# Patient Record
Sex: Female | Born: 1970 | Race: White | Marital: Married
Health system: Southern US, Community
[De-identification: ages and names within clinical notes are randomized; demographics above are authoritative.]

---

## 2014-04-13 ENCOUNTER — Ambulatory Visit: Payer: Self-pay | Admitting: Otolaryngology

## 2016-04-26 ENCOUNTER — Ambulatory Visit: Payer: Self-pay

## 2016-05-24 ENCOUNTER — Ambulatory Visit: Payer: Self-pay | Attending: Oncology

## 2020-03-15 ENCOUNTER — Other Ambulatory Visit (HOSPITAL_COMMUNITY): Payer: Self-pay | Admitting: Neurology

## 2020-03-15 ENCOUNTER — Other Ambulatory Visit: Payer: Self-pay | Admitting: Neurology

## 2020-03-15 DIAGNOSIS — R4189 Other symptoms and signs involving cognitive functions and awareness: Secondary | ICD-10-CM

## 2020-03-30 ENCOUNTER — Ambulatory Visit (HOSPITAL_COMMUNITY): Payer: 59

## 2020-03-30 ENCOUNTER — Encounter (HOSPITAL_COMMUNITY): Payer: Self-pay

## 2020-10-06 ENCOUNTER — Other Ambulatory Visit: Payer: Self-pay | Admitting: Obstetrics and Gynecology

## 2020-10-06 DIAGNOSIS — Z1231 Encounter for screening mammogram for malignant neoplasm of breast: Secondary | ICD-10-CM

## 2020-11-23 ENCOUNTER — Other Ambulatory Visit: Payer: Self-pay

## 2020-11-23 ENCOUNTER — Ambulatory Visit
Admission: RE | Admit: 2020-11-23 | Discharge: 2020-11-23 | Disposition: A | Discharge status: Home / Self Care | Payer: 59 | Source: Ambulatory Visit | Attending: Obstetrics and Gynecology | Admitting: Obstetrics and Gynecology

## 2020-11-23 DIAGNOSIS — Z1231 Encounter for screening mammogram for malignant neoplasm of breast: Secondary | ICD-10-CM | POA: Diagnosis present

## 2020-11-24 ENCOUNTER — Other Ambulatory Visit: Payer: Self-pay | Admitting: *Deleted

## 2020-11-24 ENCOUNTER — Inpatient Hospital Stay
Admission: RE | Admit: 2020-11-24 | Discharge: 2020-11-24 | Disposition: A | Discharge status: Home / Self Care | Payer: Self-pay | Source: Ambulatory Visit | Attending: *Deleted | Admitting: *Deleted

## 2020-11-24 DIAGNOSIS — Z1231 Encounter for screening mammogram for malignant neoplasm of breast: Secondary | ICD-10-CM

## 2020-11-25 ENCOUNTER — Other Ambulatory Visit: Payer: Self-pay | Admitting: Obstetrics and Gynecology

## 2020-11-30 ENCOUNTER — Other Ambulatory Visit: Payer: Self-pay | Admitting: Obstetrics and Gynecology

## 2020-11-30 DIAGNOSIS — R921 Mammographic calcification found on diagnostic imaging of breast: Secondary | ICD-10-CM

## 2020-11-30 DIAGNOSIS — R928 Other abnormal and inconclusive findings on diagnostic imaging of breast: Secondary | ICD-10-CM

## 2020-12-08 ENCOUNTER — Other Ambulatory Visit: Payer: Self-pay

## 2020-12-08 ENCOUNTER — Ambulatory Visit
Admission: RE | Admit: 2020-12-08 | Discharge: 2020-12-08 | Disposition: A | Discharge status: Home / Self Care | Payer: 59 | Source: Ambulatory Visit | Attending: Obstetrics and Gynecology | Admitting: Obstetrics and Gynecology

## 2020-12-08 DIAGNOSIS — R928 Other abnormal and inconclusive findings on diagnostic imaging of breast: Secondary | ICD-10-CM | POA: Diagnosis not present

## 2020-12-08 DIAGNOSIS — R921 Mammographic calcification found on diagnostic imaging of breast: Secondary | ICD-10-CM

## 2020-12-16 ENCOUNTER — Other Ambulatory Visit: Payer: Self-pay | Admitting: Obstetrics and Gynecology

## 2020-12-16 DIAGNOSIS — R928 Other abnormal and inconclusive findings on diagnostic imaging of breast: Secondary | ICD-10-CM

## 2020-12-16 DIAGNOSIS — R921 Mammographic calcification found on diagnostic imaging of breast: Secondary | ICD-10-CM

## 2020-12-21 ENCOUNTER — Ambulatory Visit
Admission: RE | Admit: 2020-12-21 | Discharge: 2020-12-21 | Disposition: A | Discharge status: Home / Self Care | Payer: 59 | Source: Ambulatory Visit | Attending: Obstetrics and Gynecology | Admitting: Obstetrics and Gynecology

## 2020-12-21 ENCOUNTER — Other Ambulatory Visit: Payer: Self-pay

## 2020-12-21 DIAGNOSIS — R921 Mammographic calcification found on diagnostic imaging of breast: Secondary | ICD-10-CM | POA: Insufficient documentation

## 2020-12-21 DIAGNOSIS — R928 Other abnormal and inconclusive findings on diagnostic imaging of breast: Secondary | ICD-10-CM | POA: Insufficient documentation

## 2020-12-21 HISTORY — PX: BREAST BIOPSY: SHX20

## 2020-12-22 LAB — SURGICAL PATHOLOGY

## 2022-03-28 DIAGNOSIS — R5383 Other fatigue: Secondary | ICD-10-CM | POA: Diagnosis not present

## 2022-03-28 DIAGNOSIS — R1011 Right upper quadrant pain: Secondary | ICD-10-CM | POA: Diagnosis not present

## 2022-03-28 DIAGNOSIS — E559 Vitamin D deficiency, unspecified: Secondary | ICD-10-CM | POA: Diagnosis not present

## 2022-03-28 DIAGNOSIS — E785 Hyperlipidemia, unspecified: Secondary | ICD-10-CM | POA: Diagnosis not present

## 2022-04-04 DIAGNOSIS — R1011 Right upper quadrant pain: Secondary | ICD-10-CM | POA: Diagnosis not present

## 2022-04-04 DIAGNOSIS — E785 Hyperlipidemia, unspecified: Secondary | ICD-10-CM | POA: Diagnosis not present

## 2022-04-04 DIAGNOSIS — M549 Dorsalgia, unspecified: Secondary | ICD-10-CM | POA: Diagnosis not present

## 2022-04-04 DIAGNOSIS — R5383 Other fatigue: Secondary | ICD-10-CM | POA: Diagnosis not present

## 2022-04-25 DIAGNOSIS — E785 Hyperlipidemia, unspecified: Secondary | ICD-10-CM | POA: Diagnosis not present

## 2022-04-25 DIAGNOSIS — E559 Vitamin D deficiency, unspecified: Secondary | ICD-10-CM | POA: Diagnosis not present

## 2022-04-25 DIAGNOSIS — R5383 Other fatigue: Secondary | ICD-10-CM | POA: Diagnosis not present

## 2022-04-25 DIAGNOSIS — R1011 Right upper quadrant pain: Secondary | ICD-10-CM | POA: Diagnosis not present

## 2022-04-27 DIAGNOSIS — Z01419 Encounter for gynecological examination (general) (routine) without abnormal findings: Secondary | ICD-10-CM | POA: Diagnosis not present

## 2022-04-27 DIAGNOSIS — B3731 Acute candidiasis of vulva and vagina: Secondary | ICD-10-CM | POA: Diagnosis not present

## 2022-05-01 ENCOUNTER — Other Ambulatory Visit: Payer: Self-pay | Admitting: Obstetrics and Gynecology

## 2022-05-01 DIAGNOSIS — Z1231 Encounter for screening mammogram for malignant neoplasm of breast: Secondary | ICD-10-CM

## 2022-05-08 ENCOUNTER — Other Ambulatory Visit (HOSPITAL_COMMUNITY): Payer: Self-pay | Admitting: Internal Medicine

## 2022-05-08 DIAGNOSIS — R1011 Right upper quadrant pain: Secondary | ICD-10-CM

## 2022-05-08 DIAGNOSIS — R109 Unspecified abdominal pain: Secondary | ICD-10-CM

## 2022-05-16 ENCOUNTER — Ambulatory Visit: Payer: Self-pay

## 2022-05-16 ENCOUNTER — Other Ambulatory Visit: Payer: Self-pay

## 2022-05-17 ENCOUNTER — Ambulatory Visit: Payer: Self-pay

## 2022-05-23 ENCOUNTER — Ambulatory Visit
Admission: RE | Admit: 2022-05-23 | Discharge: 2022-05-23 | Disposition: A | Discharge status: Home / Self Care | Payer: 59 | Source: Ambulatory Visit | Attending: Internal Medicine | Admitting: Internal Medicine

## 2022-05-23 ENCOUNTER — Other Ambulatory Visit (HOSPITAL_COMMUNITY): Payer: Self-pay | Admitting: Internal Medicine

## 2022-05-23 DIAGNOSIS — R109 Unspecified abdominal pain: Secondary | ICD-10-CM | POA: Insufficient documentation

## 2022-05-23 DIAGNOSIS — R1011 Right upper quadrant pain: Secondary | ICD-10-CM

## 2022-05-23 DIAGNOSIS — R102 Pelvic and perineal pain: Secondary | ICD-10-CM | POA: Diagnosis not present

## 2022-06-20 IMAGING — MG MM DIGITAL SCREENING BILAT W/ TOMO AND CAD
8 series · 8 of 24 positions shown · non-contrast
Comparison: Previous exam(s).

CLINICAL DATA: Screening.

EXAM:
DIGITAL SCREENING BILATERAL MAMMOGRAM WITH TOMOSYNTHESIS AND CAD
TECHNIQUE: Bilateral screening digital craniocaudal and mediolateral oblique
mammograms were obtained. Bilateral screening digital breast
tomosynthesis was performed. The images were evaluated with
computer-aided detection.

[L CC synth-2D]
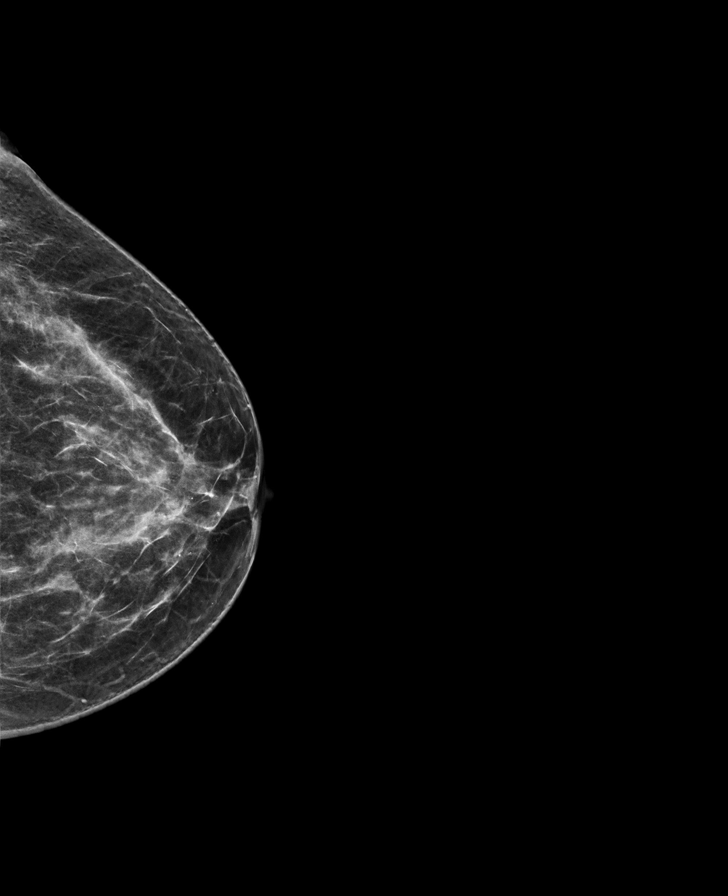

[R CC synth-2D]
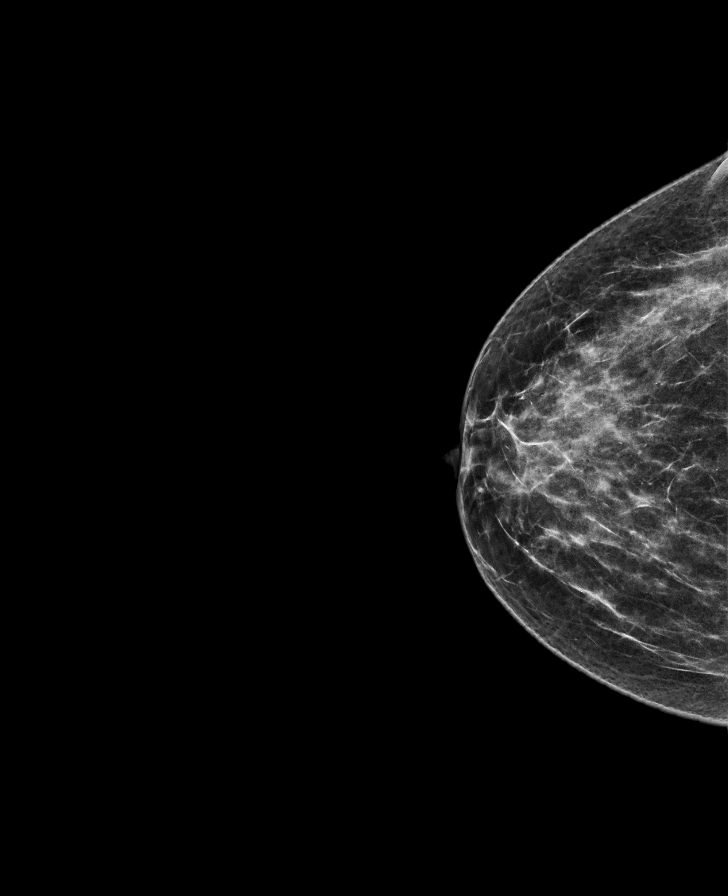

[R MLO synth-2D]
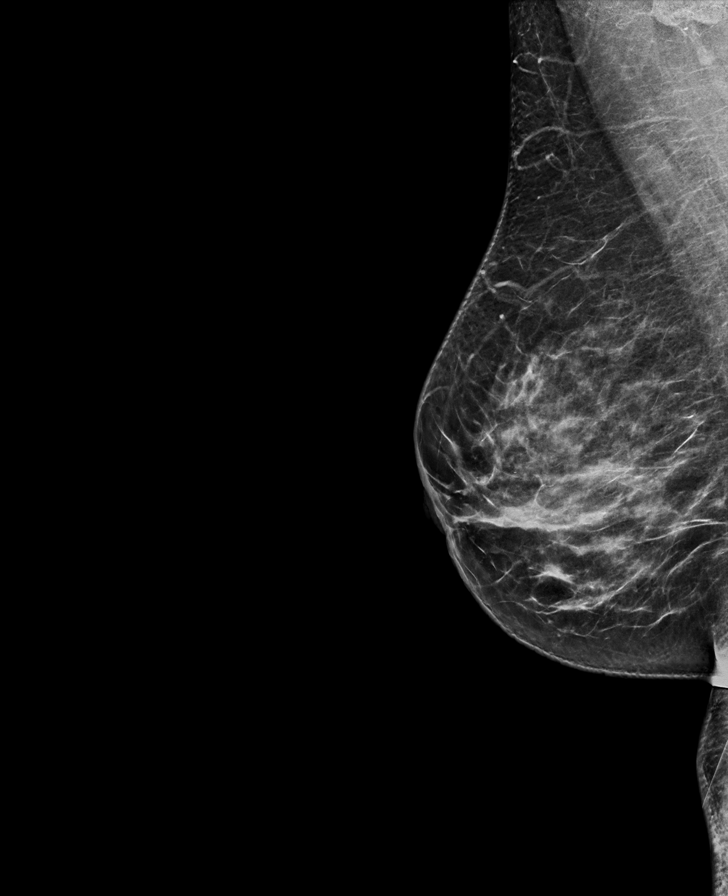

[L MLO synth-2D]
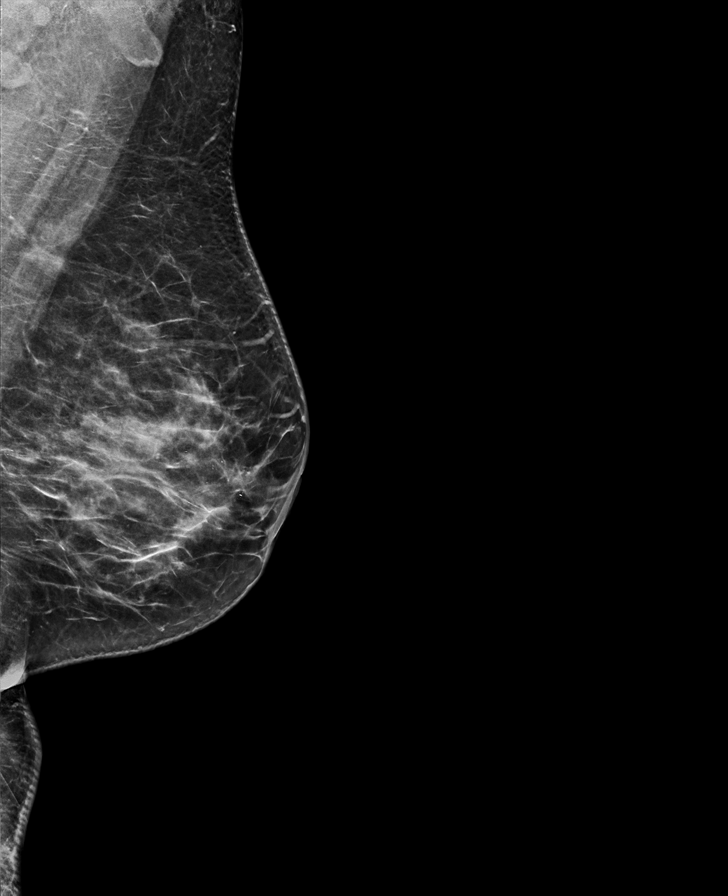

[L MLO tomo · tomo slice 33/65.0]
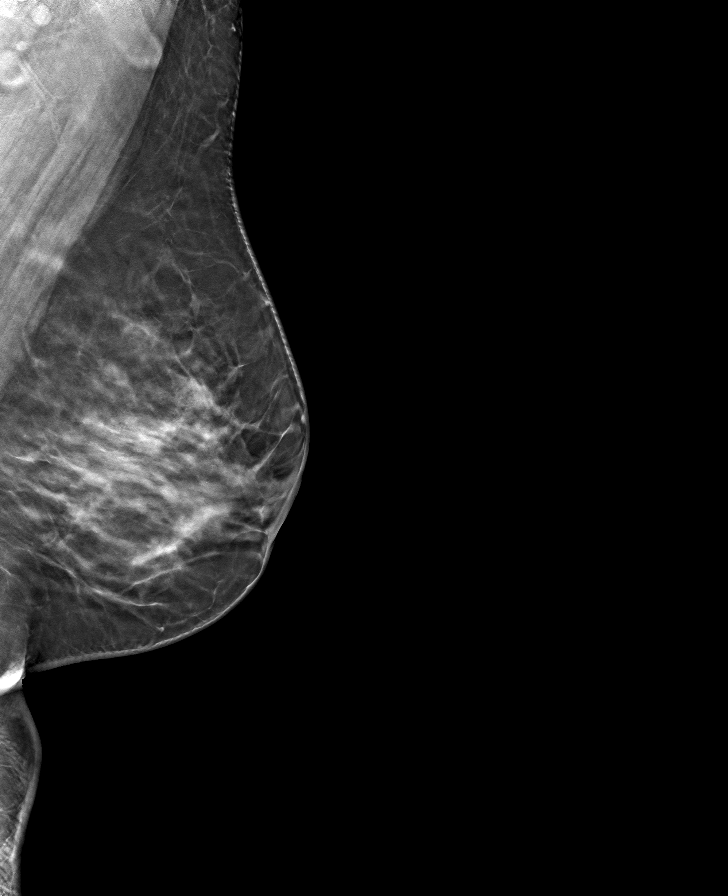

[L CC tomo · tomo slice 33/64.0]
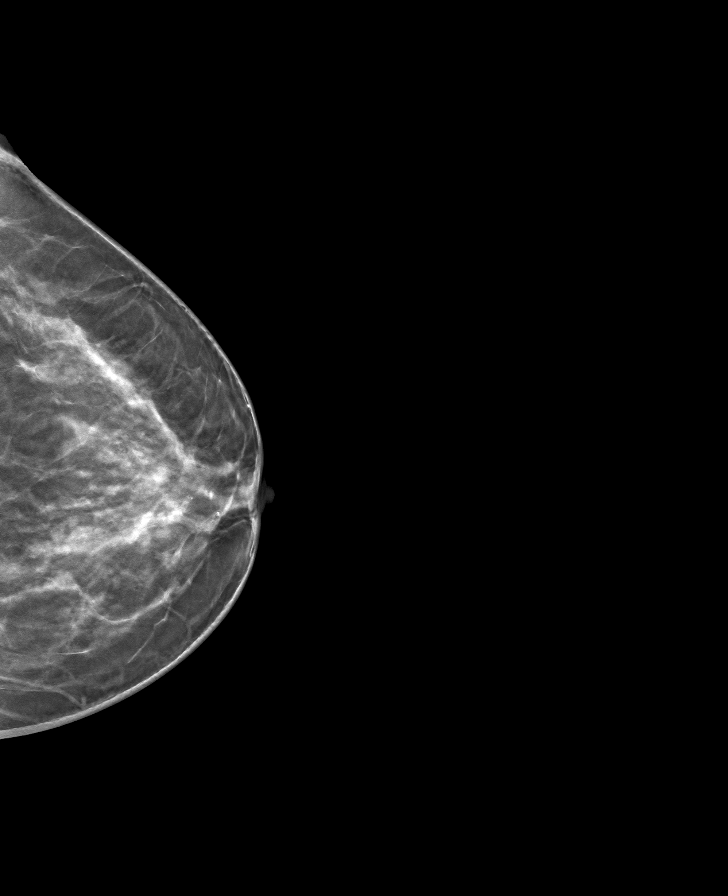

[R MLO tomo · tomo slice 33/64.0]
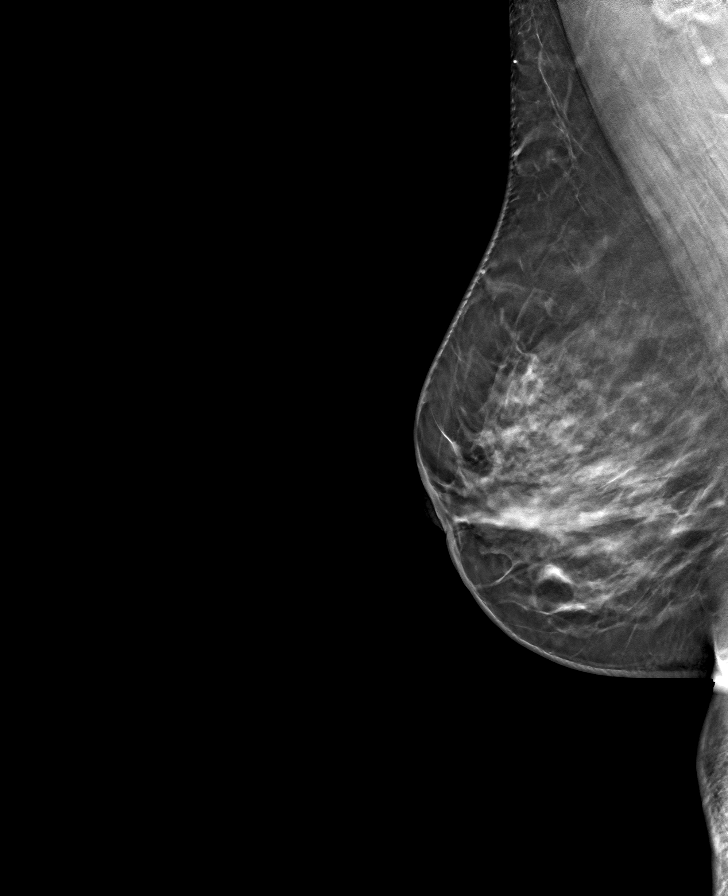

[R CC tomo · tomo slice 31/61.0]
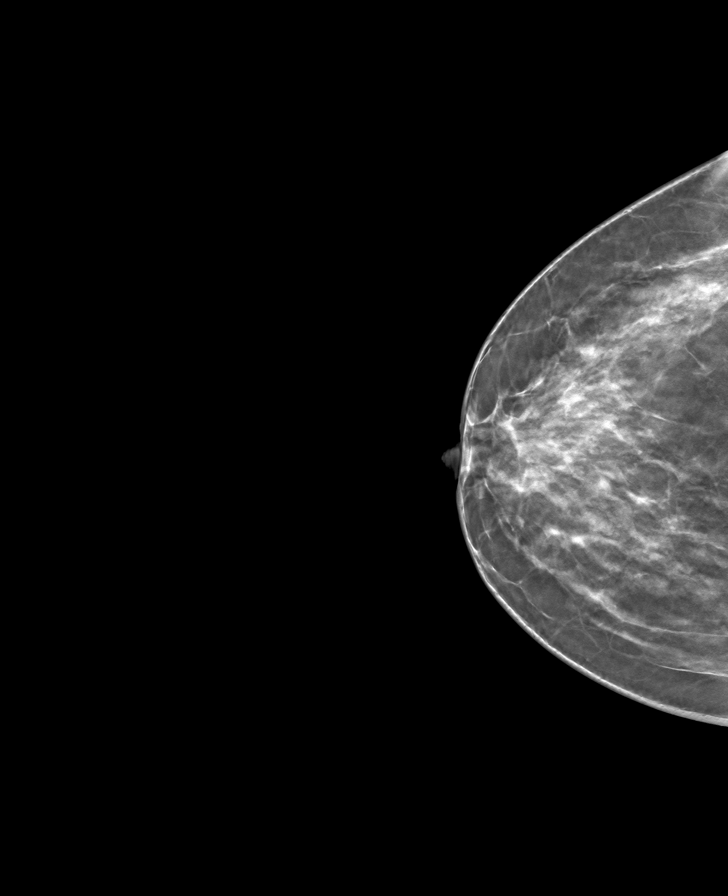

[8 of 24 positions shown; findings below may reference images not displayed]

ACR Breast Density Category c: The breast tissue is heterogeneously
dense, which may obscure small masses.
FINDINGS: In the left breast, calcifications warrant further evaluation. In
the right breast, no findings suspicious for malignancy.
IMPRESSION: Further evaluation is suggested for calcifications in the left
breast.

RECOMMENDATION:
Diagnostic mammogram of the left breast. (Code:AS-B-FFA)

The patient will be contacted regarding the findings, and additional
imaging will be scheduled.

BI-RADS CATEGORY  0: Incomplete. Need additional imaging evaluation
and/or prior mammograms for comparison.

## 2022-06-29 ENCOUNTER — Ambulatory Visit
Admission: RE | Admit: 2022-06-29 | Discharge: 2022-06-29 | Disposition: A | Discharge status: Home / Self Care | Payer: 59 | Source: Ambulatory Visit | Attending: Obstetrics and Gynecology | Admitting: Obstetrics and Gynecology

## 2022-06-29 DIAGNOSIS — Z1231 Encounter for screening mammogram for malignant neoplasm of breast: Secondary | ICD-10-CM | POA: Diagnosis not present

## 2022-07-18 IMAGING — MG MM BREAST LOCALIZATION CLIP
4 series · 4 of 12 positions shown · non-contrast
Comparison: Previous exam(s).

CLINICAL DATA: Patient is post stereotactic core needle biopsy of a
1.4 cm group of indeterminate microcalcifications over the left
retroareolar region.

EXAM:
3D DIAGNOSTIC LEFT MAMMOGRAM POST STEREOTACTIC BIOPSY

[L CC synth-2D]
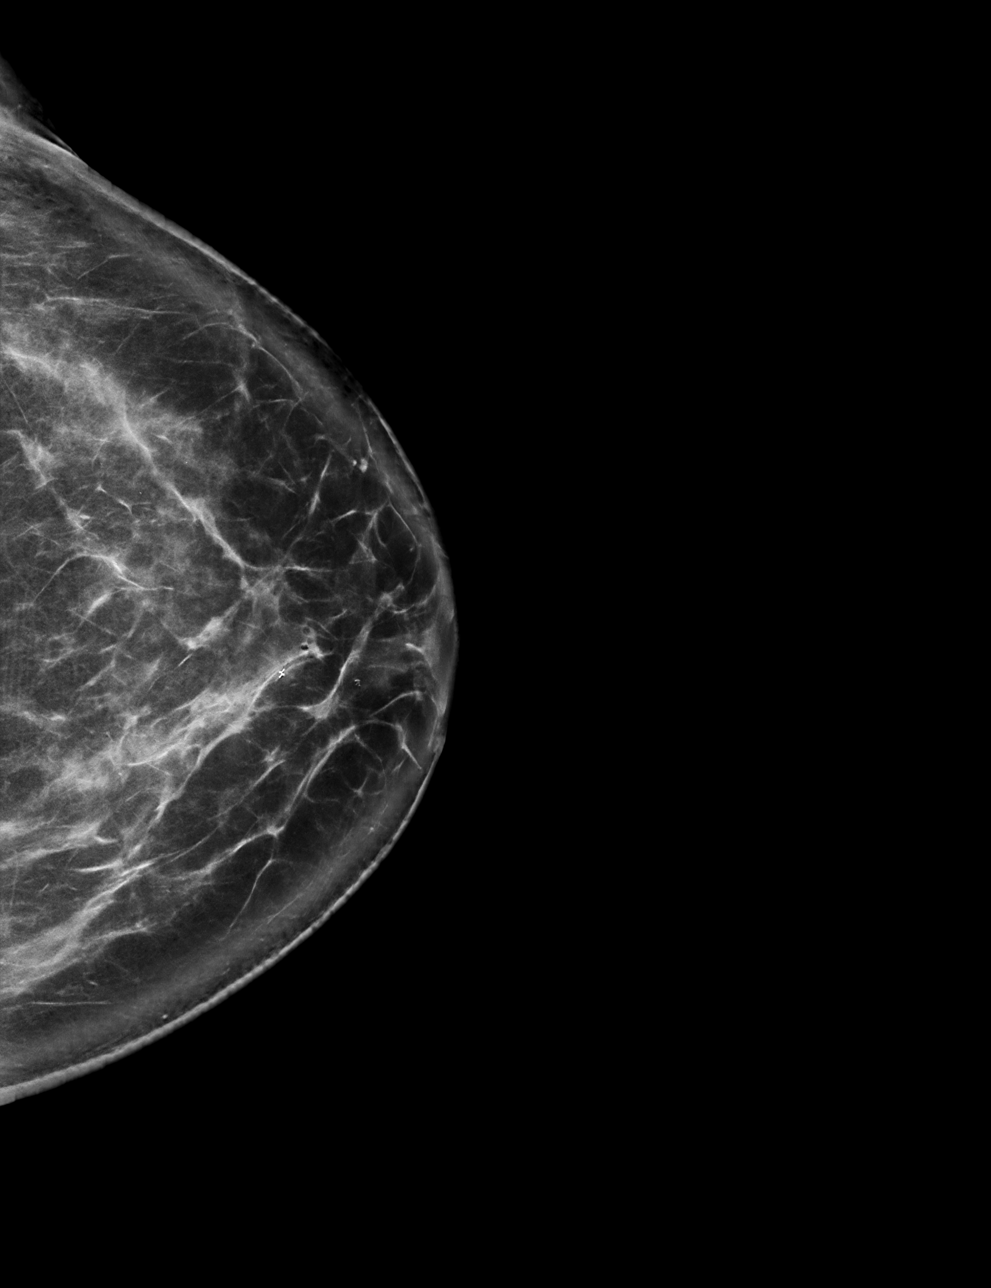

[L ML synth-2D]
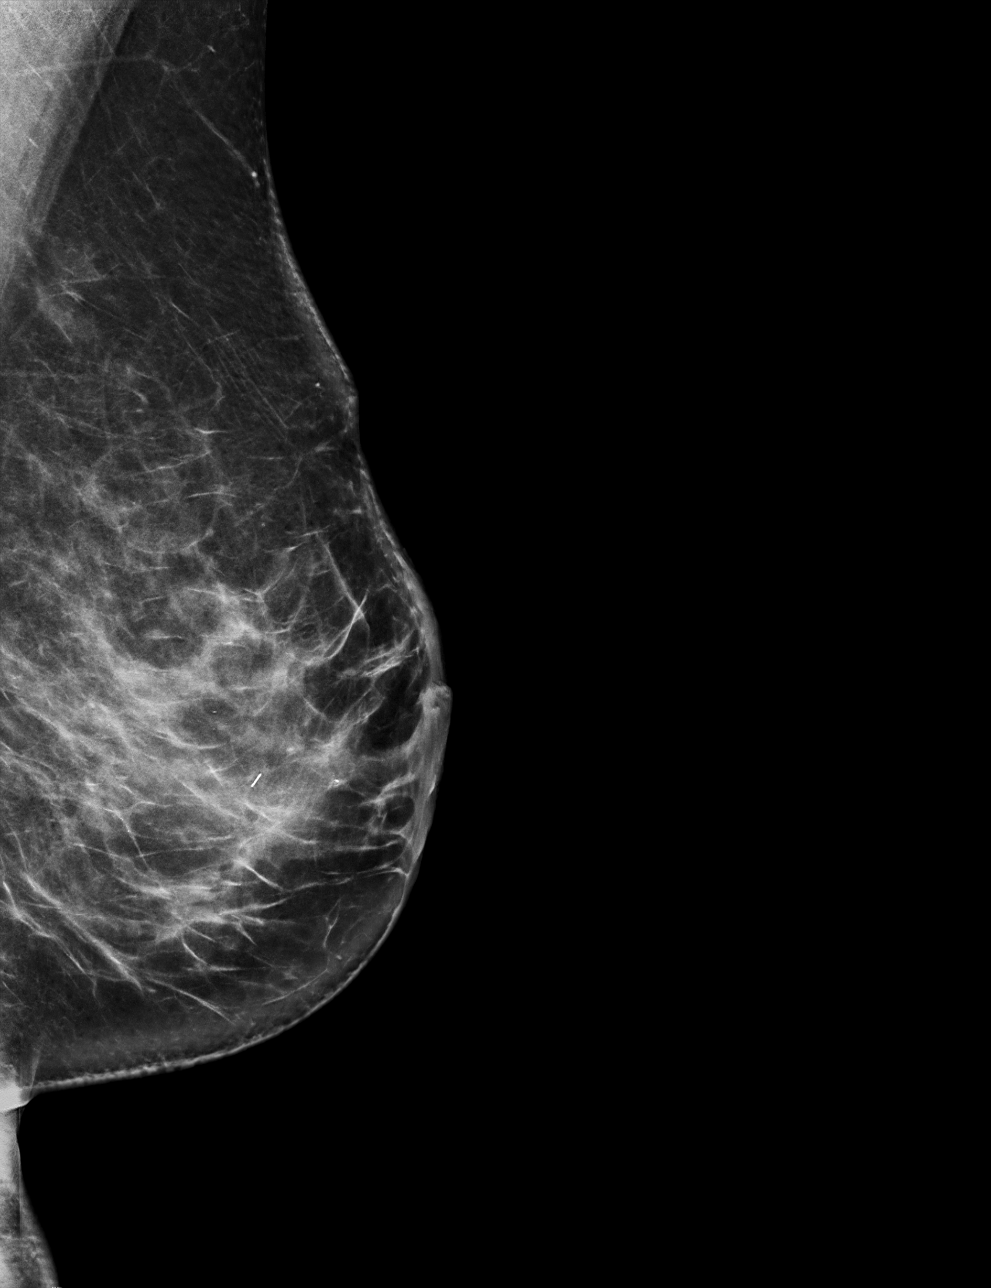

[L CC tomo · tomo slice 43/84.0]
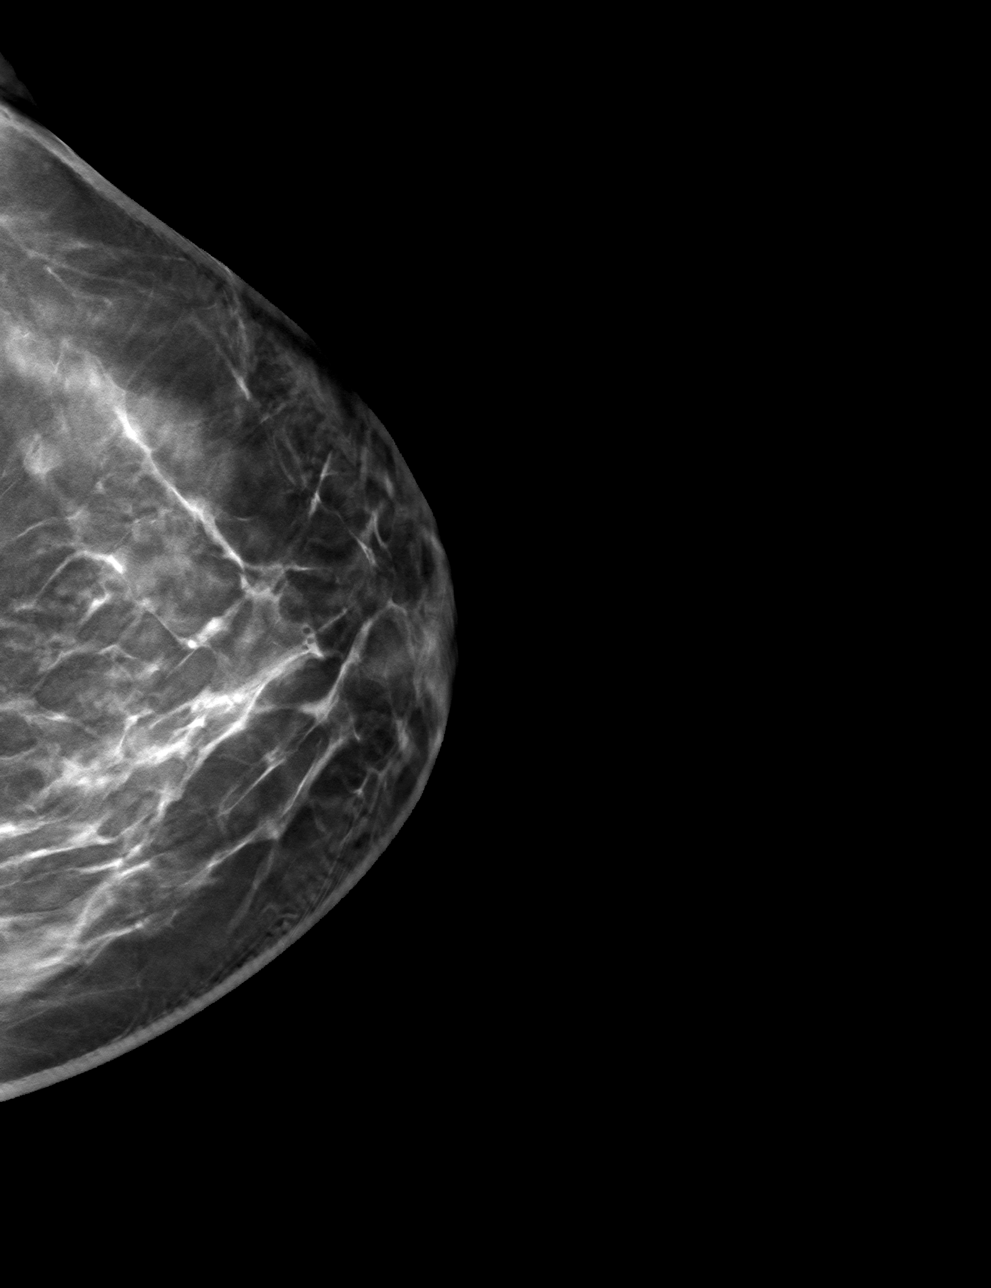

[L ML tomo · tomo slice 41/82.0]
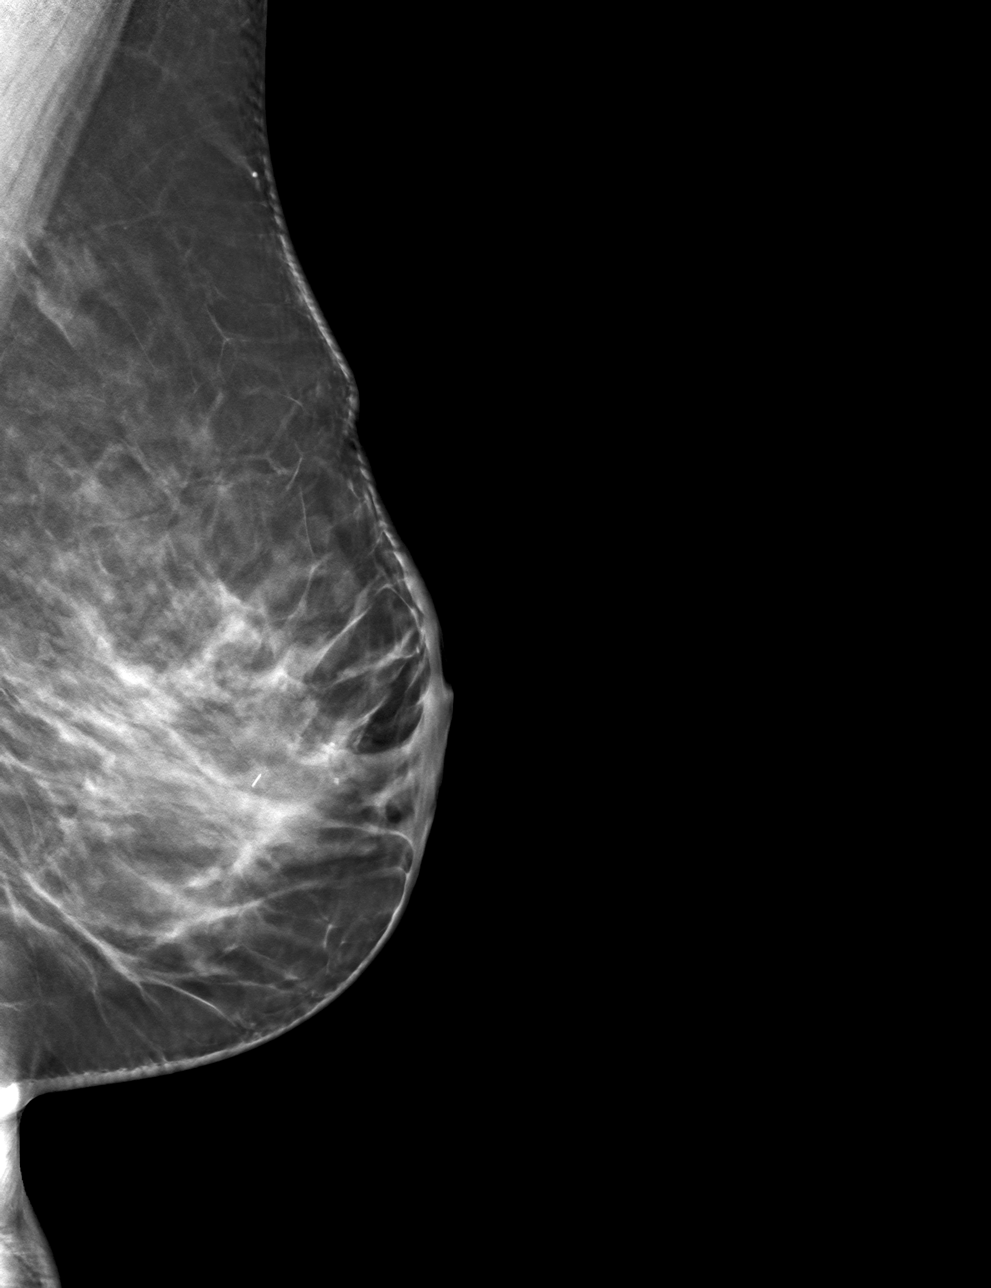

[4 of 12 positions shown; findings below may reference images not displayed]

FINDINGS: 3D Mammographic images were obtained following stereotactic guided
biopsy of the targeted microcalcifications of the left retroareolar
region. The biopsy marking clip is in expected position at the site
of biopsy.
IMPRESSION: Appropriate positioning of the X shaped biopsy marking clip at the
site of biopsy in the left retroareolar region.

Final Assessment: Post Procedure Mammograms for Marker Placement

## 2022-08-14 ENCOUNTER — Encounter: Payer: Self-pay | Admitting: Emergency Medicine

## 2022-08-14 ENCOUNTER — Emergency Department: Payer: 59

## 2022-08-14 ENCOUNTER — Emergency Department
Admission: EM | Admit: 2022-08-14 | Discharge: 2022-08-15 | Disposition: A | Discharge status: Home / Self Care | Payer: 59 | Attending: Emergency Medicine | Admitting: Emergency Medicine

## 2022-08-14 DIAGNOSIS — R112 Nausea with vomiting, unspecified: Secondary | ICD-10-CM | POA: Diagnosis not present

## 2022-08-14 DIAGNOSIS — R202 Paresthesia of skin: Secondary | ICD-10-CM | POA: Insufficient documentation

## 2022-08-14 DIAGNOSIS — R109 Unspecified abdominal pain: Secondary | ICD-10-CM

## 2022-08-14 DIAGNOSIS — R252 Cramp and spasm: Secondary | ICD-10-CM | POA: Diagnosis not present

## 2022-08-14 DIAGNOSIS — Z20822 Contact with and (suspected) exposure to covid-19: Secondary | ICD-10-CM | POA: Insufficient documentation

## 2022-08-14 LAB — URINALYSIS, ROUTINE W REFLEX MICROSCOPIC
Glucose, UA: NEGATIVE mg/dL
Hgb urine dipstick: NEGATIVE
Ketones, ur: 20 mg/dL — AB
Leukocytes,Ua: NEGATIVE
Nitrite: NEGATIVE
Protein, ur: 100 mg/dL — AB
Specific Gravity, Urine: 1.029 (ref 1.005–1.030)
pH: 8 (ref 5.0–8.0)

## 2022-08-14 LAB — CBC
HCT: 41.3 % (ref 36.0–46.0)
Hemoglobin: 14.9 g/dL (ref 12.0–15.0)
MCH: 30.2 pg (ref 26.0–34.0)
MCHC: 36.1 g/dL — ABNORMAL HIGH (ref 30.0–36.0)
MCV: 83.8 fL (ref 80.0–100.0)
Platelets: 203 10*3/uL (ref 150–400)
RBC: 4.93 MIL/uL (ref 3.87–5.11)
RDW: 12.7 % (ref 11.5–15.5)
WBC: 10.3 10*3/uL (ref 4.0–10.5)
nRBC: 0 % (ref 0.0–0.2)

## 2022-08-14 LAB — COMPREHENSIVE METABOLIC PANEL
ALT: 29 U/L (ref 0–44)
AST: 31 U/L (ref 15–41)
Albumin: 4.5 g/dL (ref 3.5–5.0)
Alkaline Phosphatase: 66 U/L (ref 38–126)
Anion gap: 11 (ref 5–15)
BUN: 13 mg/dL (ref 6–20)
CO2: 22 mmol/L (ref 22–32)
Calcium: 9.3 mg/dL (ref 8.9–10.3)
Chloride: 102 mmol/L (ref 98–111)
Creatinine, Ser: 0.78 mg/dL (ref 0.44–1.00)
GFR, Estimated: >60 mL/min (ref >60)
Glucose, Bld: 153 mg/dL — ABNORMAL HIGH (ref 70–99)
Potassium: 3.1 mmol/L — ABNORMAL LOW (ref 3.5–5.1)
Sodium: 135 mmol/L (ref 135–145)
Total Bilirubin: 1.5 mg/dL — ABNORMAL HIGH (ref 0.3–1.2)
Total Protein: 7.3 g/dL (ref 6.5–8.1)

## 2022-08-14 LAB — HCG, QUANTITATIVE, PREGNANCY: hCG, Beta Chain, Quant, S: 2 m[IU]/mL (ref <5)

## 2022-08-14 LAB — MAGNESIUM: Magnesium: 1.7 mg/dL (ref 1.7–2.4)

## 2022-08-14 LAB — TROPONIN I (HIGH SENSITIVITY): Troponin I (High Sensitivity): <2 ng/L (ref <18)

## 2022-08-14 LAB — LIPASE, BLOOD: Lipase: 32 U/L (ref 11–51)

## 2022-08-14 MED ORDER — SODIUM CHLORIDE 0.9 % IV BOLUS
1000.0000 mL | Freq: Once | INTRAVENOUS | Status: AC
  Administered 2022-08-14: 1000 mL via INTRAVENOUS

## 2022-08-14 MED ORDER — FENTANYL CITRATE PF 50 MCG/ML IJ SOSY
50.0000 ug | PREFILLED_SYRINGE | Freq: Once | INTRAMUSCULAR | Status: AC
  Administered 2022-08-14: 50 ug via INTRAVENOUS
  Filled 2022-08-14: qty 1

## 2022-08-14 MED ORDER — ONDANSETRON HCL 4 MG/2ML IJ SOLN
4.0000 mg | Freq: Once | INTRAMUSCULAR | Status: AC
  Administered 2022-08-14: 4 mg via INTRAVENOUS
  Filled 2022-08-14: qty 2

## 2022-08-14 MED ORDER — IOHEXOL 300 MG/ML  SOLN
100.0000 mL | Freq: Once | INTRAMUSCULAR | Status: AC | PRN
  Administered 2022-08-15: 100 mL via INTRAVENOUS

## 2022-08-14 NOTE — ED Triage Notes (Signed)
Pt presents ambulatory to triage via POV with complaints of lower abdominal pain with associated N/V. She notes vomiting 6-7 times; the vomit consisted of whatever she would consume. No meds taken PTA. A&Ox4 at this time. Denies diarrhea, fevers, chills, CP or SOB.

## 2022-08-14 NOTE — ED Provider Notes (Signed)
[facility] Provider Note    Event Date/Time   First MD Initiated Contact with Patient 08/14/22 2218     (approximate)   History   Abdominal Pain   HPI  Susan Kirby is a 52 y.o. female otherwise healthy comes in with abdominal pain.  Patient reports having abdominal pain that started this morning around 2 AM.  Patient reports that her symptoms have been constant with multiple episodes of nausea, vomiting.  She reports pain in her abdomen as well as full body tingling.  What her blood pressure cuff is going up she reports severe cramping in her hand every time the blood pressure goes up but this resolves when it comes back down.  Denies any obvious chest pain or shortness of breath.      Physical Exam   Triage Vital Signs: ED Triage Vitals  Enc Vitals Group     BP 08/14/22 2121 (!) 129/111     Pulse Rate 08/14/22 2121 (!) 108     Resp 08/14/22 2121 18     Temp 08/14/22 2121 98.1 F (36.7 C)     Temp src --      SpO2 08/14/22 2121 100 %     Weight 08/14/22 2119 138 lb (62.6 kg)     Height 08/14/22 2119 '5\' 5"'$  (1.651 m)     Head Circumference --      Peak Flow --      Pain Score 08/14/22 2119 9     Pain Loc --      Pain Edu? --      Excl. in [place]? --     Most recent vital signs: Vitals:   08/14/22 2121  BP: (!) 129/111  Pulse: (!) 108  Resp: 18  Temp: 98.1 F (36.7 C)  SpO2: 100%     General: Awake, no distress.  CV:  Good peripheral perfusion.  Resp:  Normal effort.  Abd:  No distention.  Patient is tender throughout her abdomen.  No rebound or guarding. Other:  Cranial nerves appear intact.  Equal strength in arms and legs.  She does have some cramping of her left hand that happens with blood pressure going off but this resolves as blood pressure cuff comes off.   ED Results / Procedures / Treatments   Labs (all labs ordered are listed, but only abnormal results are displayed) Labs Reviewed  COMPREHENSIVE METABOLIC PANEL -  Abnormal; Notable for the following components:      Result Value   Potassium 3.1 (*)    Glucose, Bld 153 (*)    Total Bilirubin 1.5 (*)    All other components within normal limits  CBC - Abnormal; Notable for the following components:   MCHC 36.1 (*)    All other components within normal limits  URINALYSIS, ROUTINE W REFLEX MICROSCOPIC - Abnormal; Notable for the following components:   Color, Urine AMBER (*)    APPearance HAZY (*)    Bilirubin Urine SMALL (*)    Ketones, ur 20 (*)    Protein, ur 100 (*)    Bacteria, UA RARE (*)    All other components within normal limits  LIPASE, BLOOD     EKG  My interpretation of EKG:  Normal sinus rhythm 86 any ST elevation or T wave versions, normal intervals  RADIOLOGY I have reviewed the xray personally and interpreted IUD looks in place PROCEDURES:  Critical Care performed: No  .1-3 Lead EKG Interpretation  Performed by: Marjean Donna  E, MD Authorized by: Vanessa Iota, MD     Interpretation: normal     ECG rate:  80   ECG rate assessment: normal     Rhythm: sinus rhythm     Ectopy: none     Conduction: normal      MEDICATIONS ORDERED IN ED: Medications  iohexol (OMNIPAQUE) 300 MG/ML solution 100 mL (has no administration in time range)  sodium chloride 0.9 % bolus 1,000 mL (1,000 mLs Intravenous New Bag/Given 08/14/22 2325)  ondansetron (ZOFRAN) injection 4 mg (4 mg Intravenous Given 08/14/22 2326)  fentaNYL (SUBLIMAZE) injection 50 mcg (50 mcg Intravenous Given 08/14/22 2327)     IMPRESSION / MDM / ASSESSMENT AND PLAN / ED COURSE  I reviewed the triage vital signs and the nursing notes.   Patient's presentation is most consistent with acute presentation with potential threat to life or bodily function.   Patient comes in with significant abdominal discomfort with nausea and vomiting full body tingling stroke exam seems to be normal and tingling started at 2 AM for stroke code was not called.  CT imaging ordered  evaluate for intracranial hemorrhage, perforation, obstruction or other acute pathology x-rays were ordered to make sure no pneumonia or IUD is significantly misplaced.  Will test for COVID, flu.  Patient will be given some fluids Zofran fentanyl for pain and reevaluated.  Lipase is normal CMP shows slightly low potassium of 3.1 CBC reassuring urine with some ketones in the urine but no evidence of UTI troponin was negative hCG negative.  Patient will be handed off to oncoming team pending imaging and reassessment.  The patient is on the cardiac monitor to evaluate for evidence of arrhythmia and/or significant heart rate changes.      FINAL CLINICAL IMPRESSION(S) / ED DIAGNOSES   Final diagnoses:  Abdominal pain, unspecified abdominal location     Rx / DC Orders   ED Discharge Orders     None        Note:  This document was prepared using Dragon voice recognition software and may include unintentional dictation errors.   Vanessa [place], MD 08/14/22 [phone]

## 2022-08-15 LAB — RESP PANEL BY RT-PCR (RSV, FLU A&B, COVID)  RVPGX2
Influenza A by PCR: NEGATIVE
Influenza B by PCR: NEGATIVE
Resp Syncytial Virus by PCR: NEGATIVE
SARS Coronavirus 2 by RT PCR: NEGATIVE

## 2022-08-15 LAB — PREGNANCY, URINE: Preg Test, Ur: NEGATIVE

## 2022-08-15 MED ORDER — DICYCLOMINE HCL 20 MG PO TABS: 20.0000 mg | ORAL_TABLET | Freq: Three times a day (TID) | ORAL | 0 refills | Status: AC | PRN

## 2022-08-15 MED ORDER — POTASSIUM CHLORIDE CRYS ER 20 MEQ PO TBCR
40.0000 meq | EXTENDED_RELEASE_TABLET | Freq: Once | ORAL | Status: AC
  Administered 2022-08-15: 40 meq via ORAL
  Filled 2022-08-15: qty 2

## 2022-08-15 MED ORDER — ONDANSETRON 4 MG PO TBDP: 4.0000 mg | ORAL_TABLET | Freq: Four times a day (QID) | ORAL | 0 refills | Status: AC | PRN

## 2022-08-15 NOTE — Discharge Instructions (Signed)
You may alternate Tylenol 1000 mg every 6 hours as needed for pain, fever and Ibuprofen 800 mg every 6-8 hours as needed for pain, fever.  Please take Ibuprofen with food.  Do not take more than 4000 mg of Tylenol (acetaminophen) in a 24 hour period.  You may use over-the-counter Imodium as needed for diarrhea.  I recommend a bland diet for the next several days.  Your lab work, urine, CT scans of your head and abdomen, ultrasound of your gallbladder and liver were normal today.  Your COVID, flu and RSV test were negative.  I suspect that you have another viral illness causing your symptoms today.

## 2022-08-15 NOTE — ED Provider Notes (Signed)
12:20 AM  Assumed care at signout.  Patient awaiting results of a COVID swab and CT head, CT abdomen pelvis.  1:14 AM Pt's right upper quadrant ultrasound, CT head, CT abdomen pelvis all reviewed and interpreted by myself and the radiologist and show no acute abnormality.  COVID, flu and RSV negative.  Patient is feeling better with improvement in her tachycardia.  She is tolerating p.o.  Abdominal exam now benign.  Suspect viral gastroenteritis.  Will discharge with Zofran, Bentyl and recommended over-the-counter Tylenol, Imodium, ibuprofen as needed.  Recommended bland diet.  Patient and son comfortable with this plan.   At this time, I do not feel there is any life-threatening condition present. I reviewed all nursing notes, vitals, pertinent previous records.  All lab and urine results, EKGs, imaging ordered have been independently reviewed and interpreted by myself.  I reviewed all available radiology reports from any imaging ordered this visit.  Based on my assessment, I feel the patient is safe to be discharged home without further emergent workup and can continue workup as an outpatient as needed. Discussed all findings, treatment plan as well as usual and customary return precautions.  They verbalize understanding and are comfortable with this plan.  Outpatient follow-up has been provided as needed.  All questions have been answered.    Chris Cripps, Delice Bison, DO 08/15/22 [phone]
